# Patient Record
Sex: Female | Born: 1995 | Race: Black or African American | Hispanic: No | Marital: Single | Smoking: Never smoker
Health system: Southern US, Community
[De-identification: ages and names within clinical notes are randomized; demographics above are authoritative.]

---

## 2016-02-23 ENCOUNTER — Emergency Department (HOSPITAL_COMMUNITY)
Admission: EM | Admit: 2016-02-23 | Discharge: 2016-02-23 | Disposition: A | Discharge status: Home / Self Care | Payer: 59

## 2016-02-23 NOTE — ED Notes (Signed)
No answer. Called pt name twice.

## 2016-02-23 NOTE — ED Notes (Signed)
No answer in waiting area and put not in bathroom.

## 2016-12-10 ENCOUNTER — Emergency Department (HOSPITAL_COMMUNITY)
Admission: EM | Admit: 2016-12-10 | Discharge: 2016-12-10 | Disposition: A | Discharge status: Home / Self Care | Payer: 59 | Attending: Emergency Medicine | Admitting: Emergency Medicine

## 2016-12-10 ENCOUNTER — Encounter (HOSPITAL_COMMUNITY): Payer: Self-pay | Admitting: *Deleted

## 2016-12-10 DIAGNOSIS — M542 Cervicalgia: Secondary | ICD-10-CM | POA: Diagnosis present

## 2016-12-10 DIAGNOSIS — R599 Enlarged lymph nodes, unspecified: Secondary | ICD-10-CM

## 2016-12-10 DIAGNOSIS — R590 Localized enlarged lymph nodes: Secondary | ICD-10-CM | POA: Diagnosis not present

## 2016-12-10 NOTE — ED Triage Notes (Signed)
Pt reports noticing a "knot" on the back of her neck on the right side. Denies drainage. Pt states it has been there "a while"

## 2016-12-10 NOTE — Discharge Instructions (Signed)
Your knot feels like a lymph node on exam. It currently is not enlarged. If you feel it enlarged for more than 1-2 weeks, please have a recheck by a physician.

## 2016-12-10 NOTE — ED Notes (Signed)
Lui at bedside

## 2016-12-10 NOTE — ED Provider Notes (Signed)
[facility]-EMERGENCY DEPT Provider Note   CSN: [id number] Arrival date & time: 12/10/16  1802     History   Chief Complaint Chief Complaint  Patient presents with  . Neck Pain    HPI Haley Berry is a 21 y.o. female.  HPI 21 year old female who presents with a small knot in the back of her head. She noticed it for the past 2-3 days, but has gotten much smaller now. States that it has felt a little larger in the past before, and it seems an on and off thing. Does not take any medications for it. States that when pushing hard on it and gets her frontal headache. She has not had any fevers, chills, drainage. No trauma or fall.   History reviewed. No pertinent past medical history.  There are no active problems to display for this patient.   History reviewed. No pertinent surgical history.  OB History    No data available       Home Medications    Prior to Admission medications   Not on File    Family History No family history on file.  Social History Social History  Substance Use Topics  . Smoking status: Never Smoker  . Smokeless tobacco: Never Used  . Alcohol use Not on file     Allergies   Mango flavor   Review of Systems Review of Systems  Constitutional: Negative for fever.  HENT: Negative for congestion.   Respiratory: Negative for cough.   Gastrointestinal: Negative for vomiting.  Musculoskeletal: Negative for neck stiffness.  Skin:       Knot to back of head   Allergic/Immunologic: Negative for immunocompromised state.  Neurological: Negative for weakness and numbness.  Hematological: Does not bruise/bleed easily.  Psychiatric/Behavioral: Negative for confusion.     Physical Exam Updated Vital Signs BP 123/63 (BP Location: Right Arm)   Pulse 77   Temp 98.4 F (36.9 C) (Oral)   Resp 18   SpO2 100%   Physical Exam Physical Exam  Constitutional: He appears well-developed and well-nourished.  HENT:  Head: Normocephalic. at the base of  the right skull there is palpable lymph node < 1 cm Eyes: Conjunctivae are normal.  Cardiovascular: Normal rate and intact distal pulses.   Pulmonary/Chest: Effort normal. No respiratory distress.  Abdominal: He exhibits no distension.  Musculoskeletal: Normal range of motion. He exhibits no deformity.  Neurological: He is alert.  Skin: Skin is warm and dry.  Psychiatric: He has a normal mood and affect. His behavior is normal.  Nursing note and vitals reviewed.   ED Treatments / Results  Labs (all labs ordered are listed, but only abnormal results are displayed) Labs Reviewed - No data to display  EKG  EKG Interpretation None       Radiology No results found.  Procedures Procedures (including critical care time) EMERGENCY DEPARTMENT US SOFT TISSUE INTERPRETATION "Study: Limited Soft Tissue Ultrasound"  INDICATIONS: Pain and knot Multiple views of the body part were obtained in real-time with a multi-frequency linear probe  PERFORMED BY: Myself IMAGES ARCHIVED?: No SIDE:Right  BODY PART:base of the skull INTERPRETATION:  No abcess noted and lymph node present    Medications Ordered in ED Medications - No data to display   Initial Impression / Assessment and Plan / ED Course  I have reviewed the triage vital signs and the nursing notes.  Pertinent labs & imaging results that were available during my care of the patient were reviewed by me and considered  in my medical decision making (see chart for details).     Presents with small knot to the back of her head. It was larger earlier, and has gotten smaller spontaneously. On exam, it feels like a lymph node that is subcentimeter. No acute management felt needed. Discussed that if enlarged for prolonged period of time would need to have re-evaluation. Strict return and follow-up instructions reviewed. She expressed understanding of all discharge instructions and felt comfortable with the plan of care.   Final  Clinical Impressions(s) / ED Diagnoses   Final diagnoses:  Palpable lymph node    New Prescriptions New Prescriptions   No medications on file     Lavera Guise, MD 12/10/16 2023

## 2017-01-08 ENCOUNTER — Encounter (HOSPITAL_COMMUNITY): Payer: Self-pay | Admitting: Emergency Medicine

## 2017-01-08 ENCOUNTER — Emergency Department (HOSPITAL_COMMUNITY)
Admission: EM | Admit: 2017-01-08 | Discharge: 2017-01-09 | Disposition: A | Discharge status: Home / Self Care | Payer: 59 | Attending: Emergency Medicine | Admitting: Emergency Medicine

## 2017-01-08 DIAGNOSIS — R1032 Left lower quadrant pain: Secondary | ICD-10-CM | POA: Diagnosis not present

## 2017-01-08 DIAGNOSIS — R1031 Right lower quadrant pain: Secondary | ICD-10-CM | POA: Insufficient documentation

## 2017-01-08 DIAGNOSIS — R1030 Lower abdominal pain, unspecified: Secondary | ICD-10-CM

## 2017-01-08 LAB — CBC
HCT: 41.2 % (ref 36.0–46.0)
HEMOGLOBIN: 14 g/dL (ref 12.0–15.0)
MCH: 29.4 pg (ref 26.0–34.0)
MCHC: 34 g/dL (ref 30.0–36.0)
MCV: 86.6 fL (ref 78.0–100.0)
Platelets: 172 10*3/uL (ref 150–400)
RBC: 4.76 MIL/uL (ref 3.87–5.11)
RDW: 13 % (ref 11.5–15.5)
WBC: 6.1 10*3/uL (ref 4.0–10.5)

## 2017-01-08 LAB — COMPREHENSIVE METABOLIC PANEL
ALBUMIN: 4.1 g/dL (ref 3.5–5.0)
ALK PHOS: 84 U/L (ref 38–126)
ALT: 8 U/L — AB (ref 14–54)
ANION GAP: 5 (ref 5–15)
AST: 13 U/L — ABNORMAL LOW (ref 15–41)
BUN: 8 mg/dL (ref 6–20)
CALCIUM: 9.4 mg/dL (ref 8.9–10.3)
CHLORIDE: 110 mmol/L (ref 101–111)
CO2: 23 mmol/L (ref 22–32)
CREATININE: 0.76 mg/dL (ref 0.44–1.00)
GFR calc non Af Amer: >60 mL/min (ref >60)
GLUCOSE: 115 mg/dL — AB (ref 65–99)
Potassium: 3.9 mmol/L (ref 3.5–5.1)
SODIUM: 138 mmol/L (ref 135–145)
Total Bilirubin: 0.3 mg/dL (ref 0.3–1.2)
Total Protein: 6.7 g/dL (ref 6.5–8.1)

## 2017-01-08 LAB — LIPASE, BLOOD: LIPASE: 21 U/L (ref 11–51)

## 2017-01-08 LAB — HCG, QUANTITATIVE, PREGNANCY: hCG, Beta Chain, Quant, S: <1 m[IU]/mL (ref <5)

## 2017-01-08 NOTE — ED Triage Notes (Signed)
Pt brought in by EMS for c/o lower abd pain  Pt has tenderness upon palpation  Pt states it is a stabbing type pain  Pt states she has had the pain for about 14 hrs  Pt has nausea and diarrhea but no vomiting  Last VM was today

## 2017-01-09 ENCOUNTER — Encounter (HOSPITAL_COMMUNITY): Payer: Self-pay

## 2017-01-09 ENCOUNTER — Emergency Department (HOSPITAL_COMMUNITY): Payer: 59

## 2017-01-09 LAB — URINALYSIS, ROUTINE W REFLEX MICROSCOPIC
Bilirubin Urine: NEGATIVE
Glucose, UA: NEGATIVE mg/dL
Hgb urine dipstick: NEGATIVE
Ketones, ur: NEGATIVE mg/dL
LEUKOCYTES UA: NEGATIVE
NITRITE: NEGATIVE
Protein, ur: NEGATIVE mg/dL
SPECIFIC GRAVITY, URINE: 1.01 (ref 1.005–1.030)
pH: 6 (ref 5.0–8.0)

## 2017-01-09 MED ORDER — SODIUM CHLORIDE 0.9 % IV BOLUS (SEPSIS)
1000.0000 mL | Freq: Once | INTRAVENOUS | Status: AC
  Administered 2017-01-09: 1000 mL via INTRAVENOUS

## 2017-01-09 MED ORDER — HYDROCODONE-ACETAMINOPHEN 5-325 MG PO TABS: 1.0000 | ORAL_TABLET | Freq: Four times a day (QID) | ORAL | 0 refills | Status: AC | PRN

## 2017-01-09 MED ORDER — IOPAMIDOL (ISOVUE-300) INJECTION 61%
100.0000 mL | Freq: Once | INTRAVENOUS | Status: AC | PRN
  Administered 2017-01-09: 100 mL via INTRAVENOUS

## 2017-01-09 MED ORDER — IOPAMIDOL (ISOVUE-300) INJECTION 61%
INTRAVENOUS | Status: AC
  Administered 2017-01-09: 100 mL via INTRAVENOUS
  Filled 2017-01-09: qty 100

## 2017-01-09 NOTE — ED Notes (Signed)
PT states that she has had bilateral lower abdomen pain described as sharp/ stabbing that started around 9 am on 01/08/17, pain worsened and pt called EMS. Pt states that she hjad taken Ibuprofen for pain and was effective. Pt does report nausea/diarrhea  associated symptoms.

## 2017-01-09 NOTE — ED Provider Notes (Signed)
[facility]-EMERGENCY DEPT Provider Note   CSN: [id number] Arrival date & time: 01/08/17  2138  By signing my name below, I, Haley Berry, attest that this documentation has been prepared under the direction and in the presence of Haley Lyons, MD. Electronically Signed: Diona Berry, ED Scribe. 01/09/17. 12:30 AM.   History   Chief Complaint Chief Complaint  Patient presents with  . Abdominal Pain    HPI Haley Berry is a 21 y.o. female who presents to the Emergency Department complaining of gradually worsening, constant, lower abdominal pain since 9 am on 01/08/17. Pt notes pain radiates to her back. Associated sx include nausea, diarrhea, shaking and chills. She took ibuprofen all day with temporary relief. Currently she is not in much pain because she took 3 ibuprofen PTA, however, she can feel the pain starting to come back. Receives a Depo-Provera shot. Pt denies fever, vomiting, urgency, frequency, hematuria, dysuria, difficulty urinating, constipation, or blood in her stool.  The history is provided by the patient. No language interpreter was used.  Abdominal Pain   This is a new problem. The current episode started 12 to 24 hours ago. The problem occurs constantly. The problem has been gradually worsening. The pain is located in the RLQ and LLQ. The pain is moderate. Associated symptoms include diarrhea and nausea. Pertinent negatives include fever, vomiting, constipation, dysuria, frequency and hematuria. Nothing aggravates the symptoms. The symptoms are relieved by acetaminophen.    History reviewed. No pertinent past medical history.  There are no active problems to display for this patient.   History reviewed. No pertinent surgical history.  OB History    No data available       Home Medications    Prior to Admission medications   Not on File    Family History History reviewed. No pertinent family history.  Social History Social History  Substance Use Topics   . Smoking status: Never Smoker  . Smokeless tobacco: Never Used  . Alcohol use Yes     Comment: social     Allergies   Mango flavor   Review of Systems Review of Systems  Constitutional: Positive for chills. Negative for fever.  Gastrointestinal: Positive for abdominal pain, diarrhea and nausea. Negative for blood in stool, constipation and vomiting.  Genitourinary: Negative for difficulty urinating, dysuria, frequency, hematuria and urgency.     Physical Exam Updated Vital Signs BP 121/68   Pulse 60   Temp 97.8 F (36.6 C) (Oral)   Resp 19   Ht  (1.727 m)   Wt 143 lb (64.9 kg)   LMP 01/02/2017 (Exact Date)   SpO2 100%   BMI 21.74 kg/m   Physical Exam  Constitutional: She is oriented to person, place, and time. She appears well-developed and well-nourished. No distress.  HENT:  Head: Normocephalic and atraumatic.  Right Ear: Hearing normal.  Left Ear: Hearing normal.  Nose: Nose normal.  Mouth/Throat: Oropharynx is clear and moist and mucous membranes are normal.  Eyes: Conjunctivae and EOM are normal. Pupils are equal, round, and reactive to light.  Neck: Normal range of motion. Neck supple.  Cardiovascular: Regular rhythm, S1 normal and S2 normal.  Exam reveals no gallop and no friction rub.   No murmur heard. Pulmonary/Chest: Effort normal and breath sounds normal. No respiratory distress. She exhibits no tenderness.  Abdominal: Soft. Normal appearance and bowel sounds are normal. There is no hepatosplenomegaly. There is tenderness. There is no rebound, no guarding, no tenderness at McBurney's point and negative  Murphy's sign. No hernia.  TTP in the RLQ, LLQ and suprapubic region.  Musculoskeletal: Normal range of motion.  Neurological: She is alert and oriented to person, place, and time. She has normal strength. No cranial nerve deficit or sensory deficit. Coordination normal. GCS eye subscore is 4. GCS verbal subscore is 5. GCS motor subscore is 6.    Skin: Skin is warm, dry and intact. No rash noted. No cyanosis.  Psychiatric: She has a normal mood and affect. Her speech is normal and behavior is normal. Thought content normal.  Nursing note and vitals reviewed.    ED Treatments / Results  DIAGNOSTIC STUDIES: Oxygen Saturation is 100% on RA, normal by my interpretation.   COORDINATION OF CARE: 12:30 AM-Discussed next steps with pt. Pt verbalized understanding and is agreeable with the plan.    Labs (all labs ordered are listed, but only abnormal results are displayed) Labs Reviewed  COMPREHENSIVE METABOLIC PANEL - Abnormal; Notable for the following:       Result Value   Glucose, Bld 115 (*)    AST 13 (*)    ALT 8 (*)    All other components within normal limits  URINALYSIS, ROUTINE W REFLEX MICROSCOPIC - Abnormal; Notable for the following:    Color, Urine STRAW (*)    All other components within normal limits  LIPASE, BLOOD  CBC  HCG, QUANTITATIVE, PREGNANCY    EKG  EKG Interpretation None       Radiology No results found.  Procedures Procedures (including critical care time)  Medications Ordered in ED Medications  sodium chloride 0.9 % bolus 1,000 mL (not administered)     Initial Impression / Assessment and Plan / ED Course  I have reviewed the triage vital signs and the nursing notes.  Pertinent labs & imaging results that were available during my care of the patient were reviewed by me and considered in my medical decision making (see chart for details).  CT scan shows only a small volume of free fluid in the pelvis, but no focal inflammation, obstruction, or perforation. She will be discharged with pain medicine and follow-up as needed.  Final Clinical Impressions(s) / ED Diagnoses   Final diagnoses:  None    New Prescriptions New Prescriptions   No medications on file   I personally performed the services described in this documentation, which was scribed in my presence. The recorded  information has been reviewed and is accurate.       Haley Lyons, MD 01/09/17 [phone]

## 2017-01-09 NOTE — Discharge Instructions (Signed)
Hydrocodone as prescribed as needed for pain.  Return to the emergency department if you develop worsening pain, high fever, bloody vomit or stool, or other new and concerning symptoms.

## 2017-09-09 IMAGING — CT CT ABD-PELV W/ CM
2 of 4 series · 16 of 46 positions shown, 18 images · IV contrast (ISOVUE)
Comparison: None.

CLINICAL DATA: Increasing lower abdominal pain since [DATE]
yesterday, radiating to the back.

EXAM:
CT ABDOMEN AND PELVIS WITH CONTRAST
TECHNIQUE: Multidetector CT imaging of the abdomen and pelvis was performed
using the standard protocol following bolus administration of
intravenous contrast.
CONTRAST:  100 mL Gsovue-NDD intravenous

[Series 2: abd/pel with · axial · 0.66mm/px · z∈[+1287,+1662]mm · 13 of 83 slices shown, 15 images]
[im 4/83  soft-tissue]
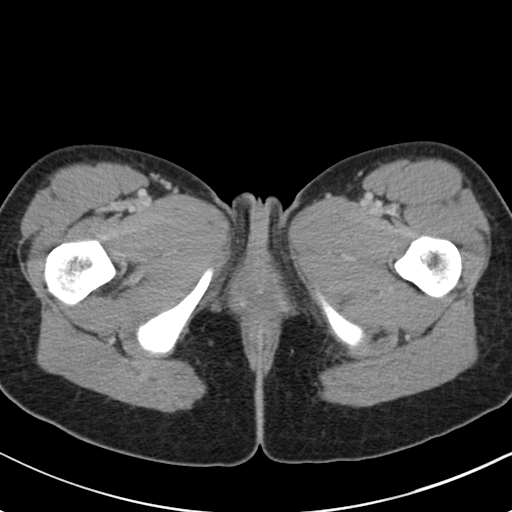
[im 4/83  bone]
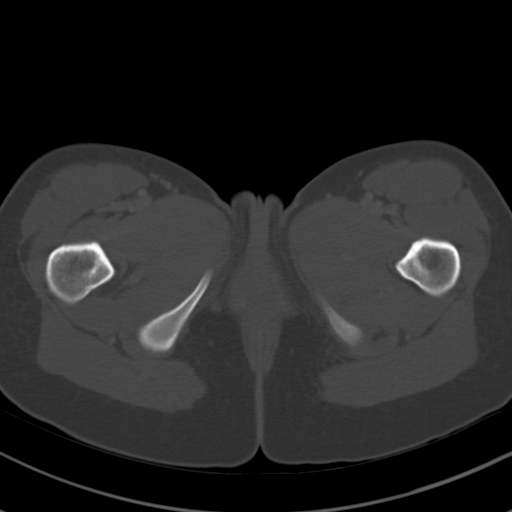
[im 12/83  soft-tissue]
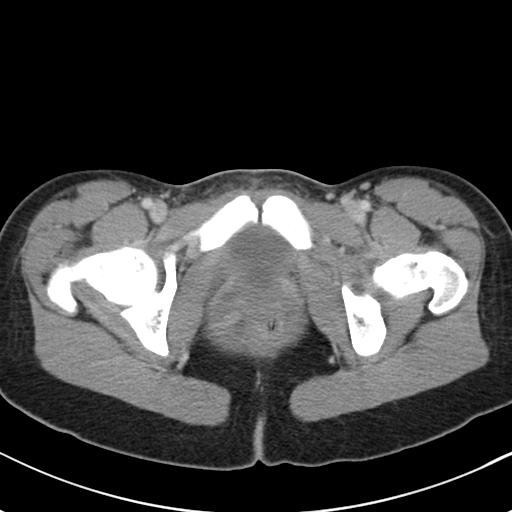
[im 16/83  soft-tissue]
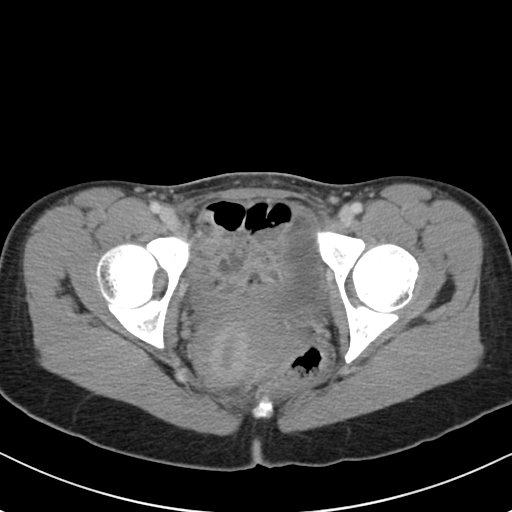
[im 24/83  soft-tissue]
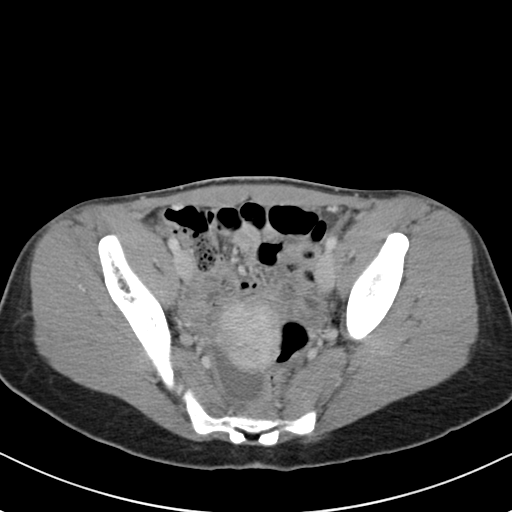
[im 28/83  soft-tissue]
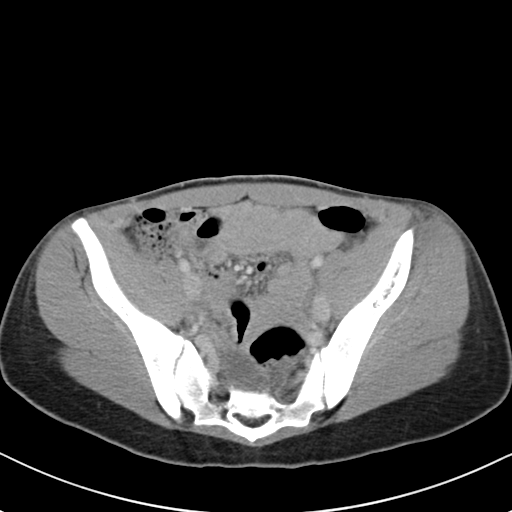
[im 36/83  soft-tissue]
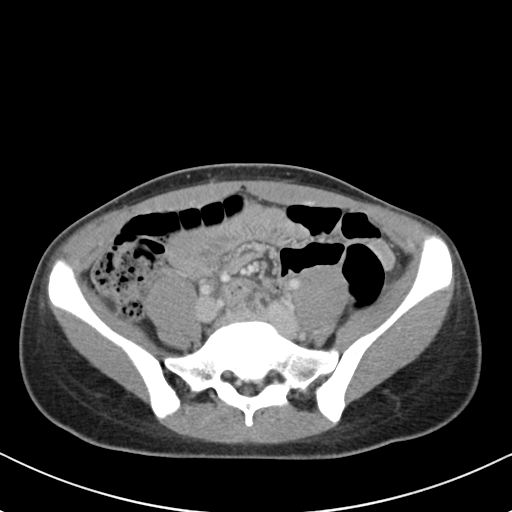
[im 43/83  soft-tissue]
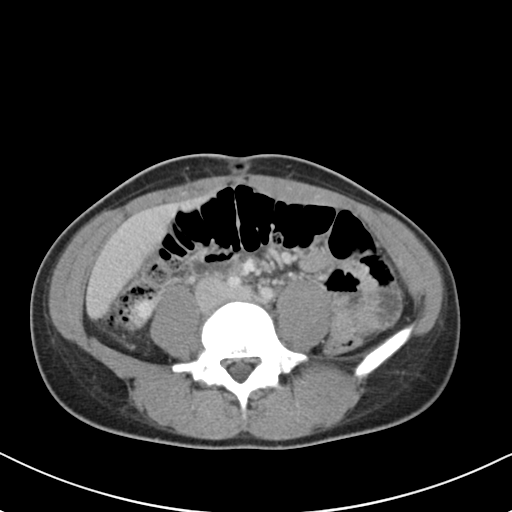
[im 47/83  soft-tissue]
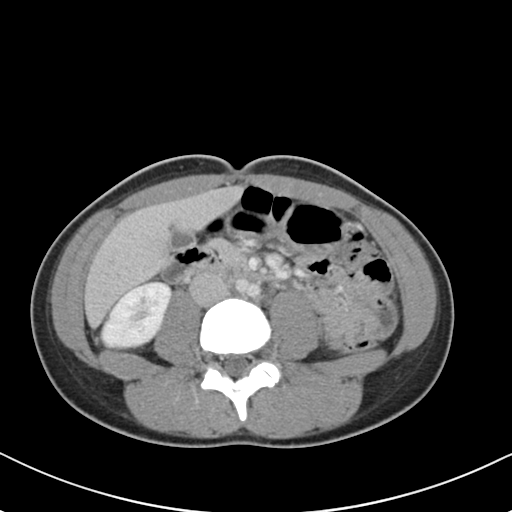
[im 55/83  soft-tissue]
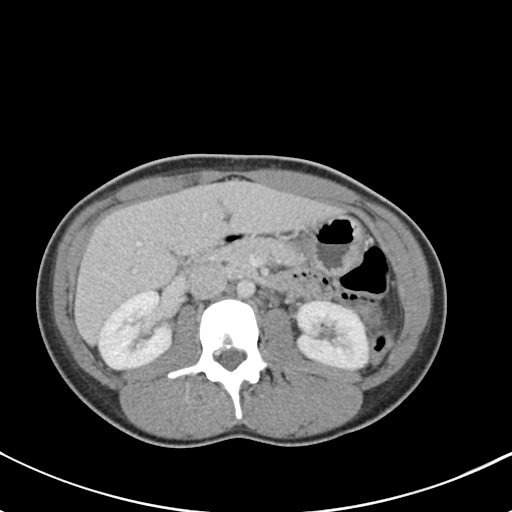
[im 55/83  bone]
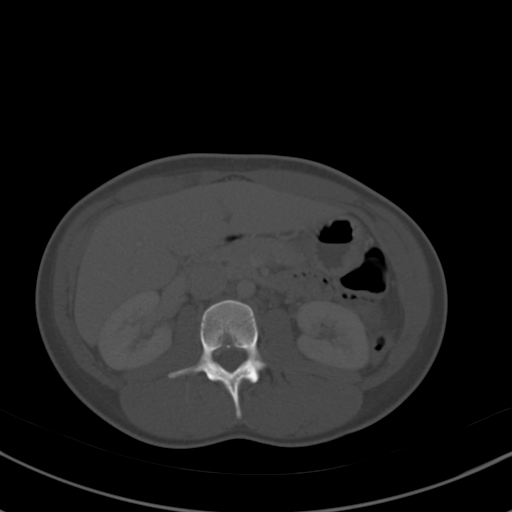
[im 59/83  soft-tissue]
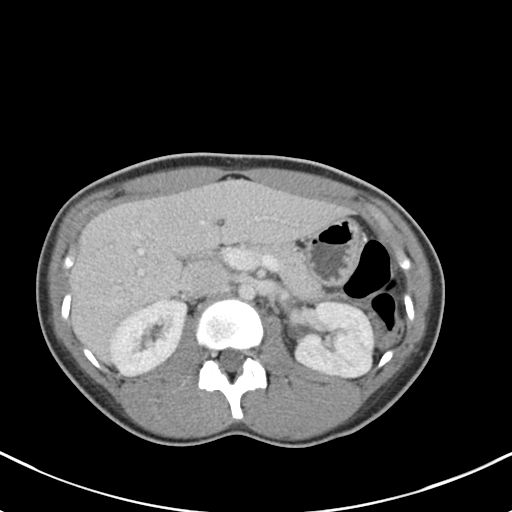
[im 67/83  soft-tissue]
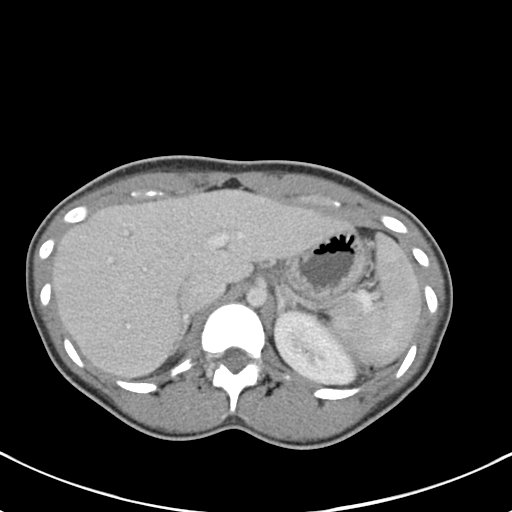
[im 71/83  soft-tissue]
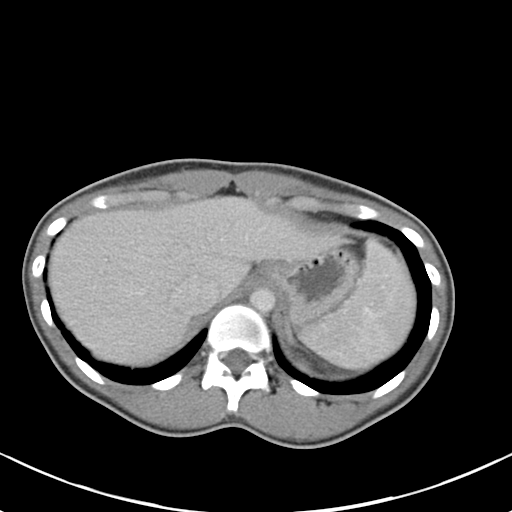
[im 79/83  soft-tissue]
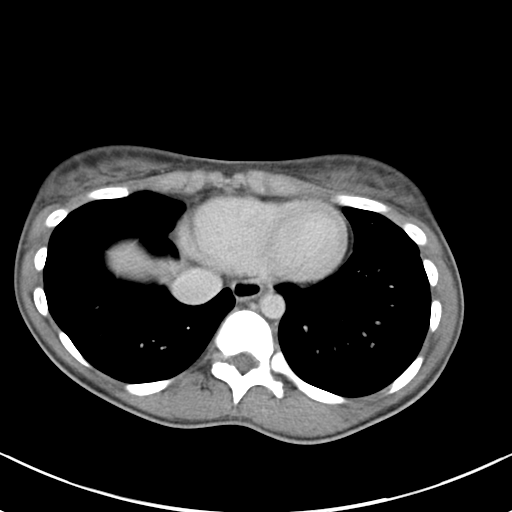

[Series 4: coronal a/|p · coronal · 0.64mm/px · 3 of 91 slices shown]
[im 31/91  soft-tissue]
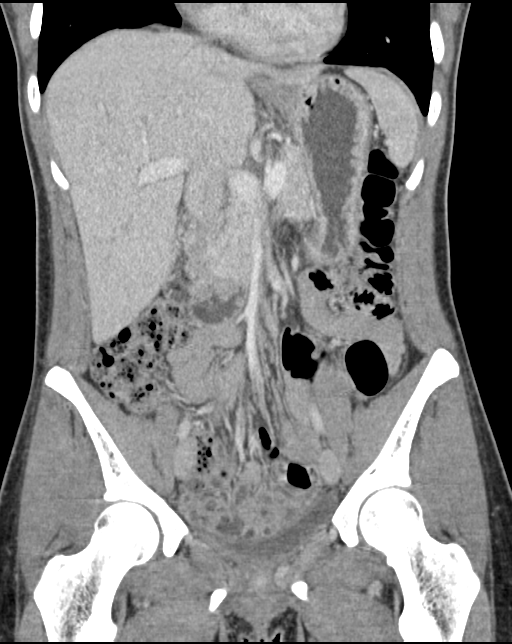
[im 41/91  soft-tissue]
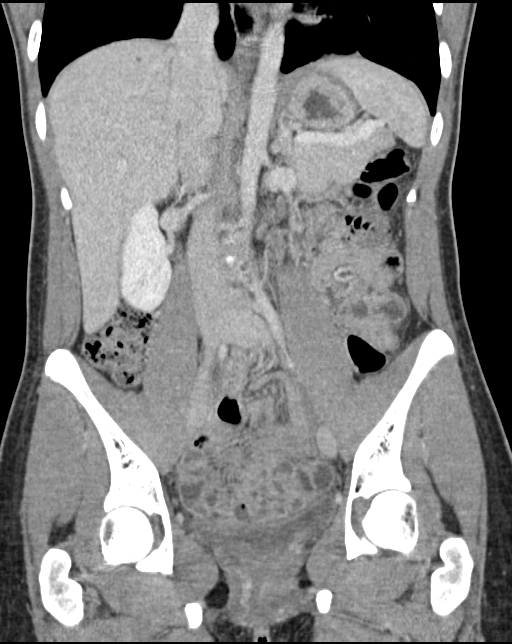
[im 51/91  soft-tissue]
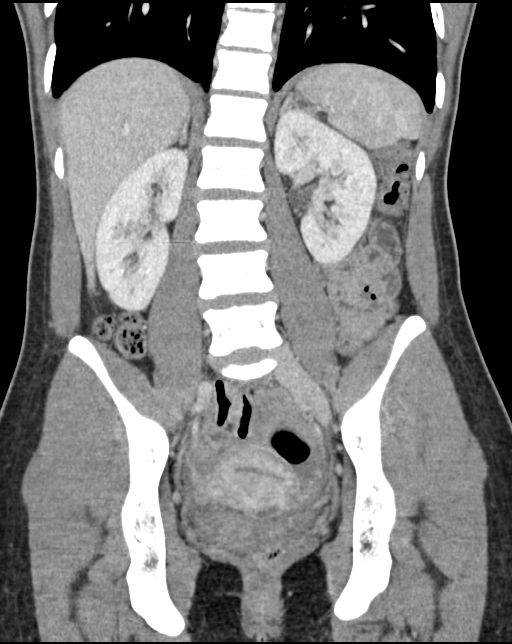

[16 of 46 positions shown; findings below may reference images not displayed]

FINDINGS: Lower chest: No acute abnormality.

Hepatobiliary: No focal liver abnormality is seen. No gallstones,
gallbladder wall thickening, or biliary dilatation.

Pancreas: Unremarkable. No pancreatic ductal dilatation or
surrounding inflammatory changes.

Spleen: Normal in size without focal abnormality.

Adrenals/Urinary Tract: Adrenal glands are unremarkable. Kidneys are
normal, without renal calculi, focal lesion, or hydronephrosis.
Bladder is unremarkable.

Stomach/Bowel: Stomach is within normal limits. Appendix appears
normal. No evidence of bowel wall thickening, distention, or
inflammatory changes.

Vascular/Lymphatic: No significant vascular findings are present. No
enlarged abdominal or pelvic lymph nodes.

Reproductive: Uterus and bilateral adnexa are unremarkable.

Other: Small volume free pelvic fluid.  No focal inflammation.

Musculoskeletal: No significant skeletal lesion.
IMPRESSION: Small volume free pelvic fluid. No focal inflammation. No bowel
obstruction or perforation.
